# Patient Record
Sex: Male | Born: 1990 | Race: Asian | Hispanic: No | Marital: Single | Smoking: Never smoker
Health system: Southern US, Community
[De-identification: ages and names within clinical notes are randomized; demographics above are authoritative.]

## PROBLEM LIST (undated history)

## (undated) HISTORY — PX: LEG SURGERY: SHX1003

---

## 2014-12-21 ENCOUNTER — Emergency Department (HOSPITAL_COMMUNITY): Payer: BLUE CROSS/BLUE SHIELD

## 2014-12-21 ENCOUNTER — Emergency Department (HOSPITAL_COMMUNITY)
Admission: EM | Admit: 2014-12-21 | Discharge: 2014-12-21 | Disposition: A | Discharge status: Home / Self Care | Payer: BLUE CROSS/BLUE SHIELD | Attending: Emergency Medicine | Admitting: Emergency Medicine

## 2014-12-21 ENCOUNTER — Encounter (HOSPITAL_COMMUNITY): Payer: Self-pay | Admitting: Emergency Medicine

## 2014-12-21 DIAGNOSIS — S3992XA Unspecified injury of lower back, initial encounter: Secondary | ICD-10-CM | POA: Diagnosis not present

## 2014-12-21 DIAGNOSIS — S8991XA Unspecified injury of right lower leg, initial encounter: Secondary | ICD-10-CM | POA: Diagnosis not present

## 2014-12-21 DIAGNOSIS — Y998 Other external cause status: Secondary | ICD-10-CM | POA: Insufficient documentation

## 2014-12-21 DIAGNOSIS — M7918 Myalgia, other site: Secondary | ICD-10-CM

## 2014-12-21 DIAGNOSIS — Y9389 Activity, other specified: Secondary | ICD-10-CM | POA: Diagnosis not present

## 2014-12-21 DIAGNOSIS — S199XXA Unspecified injury of neck, initial encounter: Secondary | ICD-10-CM | POA: Insufficient documentation

## 2014-12-21 DIAGNOSIS — Y92410 Unspecified street and highway as the place of occurrence of the external cause: Secondary | ICD-10-CM | POA: Diagnosis not present

## 2014-12-21 MED ORDER — OXYCODONE-ACETAMINOPHEN 5-325 MG PO TABS
1.0000 | ORAL_TABLET | Freq: Once | ORAL | Status: AC
  Administered 2014-12-21: 1 via ORAL
  Filled 2014-12-21: qty 1

## 2014-12-21 MED ORDER — NAPROXEN 250 MG PO TABS
500.0000 mg | ORAL_TABLET | Freq: Once | ORAL | Status: AC
  Administered 2014-12-21: 500 mg via ORAL
  Filled 2014-12-21: qty 2

## 2014-12-21 MED ORDER — NAPROXEN 500 MG PO TABS: 500.0000 mg | ORAL_TABLET | Freq: Two times a day (BID) | ORAL | Status: AC

## 2014-12-21 MED ORDER — METHOCARBAMOL 500 MG PO TABS
500.0000 mg | ORAL_TABLET | Freq: Once | ORAL | Status: AC
  Administered 2014-12-21: 500 mg via ORAL
  Filled 2014-12-21: qty 1

## 2014-12-21 MED ORDER — METHOCARBAMOL 500 MG PO TABS
500.0000 mg | ORAL_TABLET | Freq: Two times a day (BID) | ORAL | Status: AC
Start: 2014-12-21 — End: ?

## 2014-12-21 MED ORDER — OXYCODONE-ACETAMINOPHEN 5-325 MG PO TABS: 2.0000 | ORAL_TABLET | ORAL | Status: AC | PRN

## 2014-12-21 NOTE — ED Notes (Signed)
Pt transported to CT ?
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
